# Patient Record
Sex: Female | Born: 1990 | Race: Black or African American | Hispanic: No | Marital: Single | Smoking: Current every day smoker
Health system: Southern US, Community
[De-identification: ages and names within clinical notes are randomized; demographics above are authoritative.]

## PROBLEM LIST (undated history)

## (undated) DIAGNOSIS — L309 Dermatitis, unspecified: Secondary | ICD-10-CM

## (undated) DIAGNOSIS — J302 Other seasonal allergic rhinitis: Secondary | ICD-10-CM

## (undated) DIAGNOSIS — E079 Disorder of thyroid, unspecified: Secondary | ICD-10-CM

---
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
## 2008-06-28 ENCOUNTER — Emergency Department (HOSPITAL_COMMUNITY): Admission: EM | Admit: 2008-06-28 | Discharge: 2008-06-28 | Payer: Self-pay | Admitting: Emergency Medicine

## 2009-01-12 ENCOUNTER — Encounter: Admission: RE | Admit: 2009-01-12 | Discharge: 2009-01-12 | Payer: Self-pay | Admitting: Internal Medicine

## 2009-01-19 ENCOUNTER — Emergency Department (HOSPITAL_COMMUNITY): Admission: EM | Admit: 2009-01-19 | Discharge: 2009-01-19 | Payer: Self-pay | Admitting: Emergency Medicine

## 2010-06-08 ENCOUNTER — Emergency Department (HOSPITAL_COMMUNITY): Admission: EM | Admit: 2010-06-08 | Discharge: 2010-06-08 | Payer: Self-pay | Admitting: Emergency Medicine

## 2010-11-14 LAB — URINALYSIS, ROUTINE W REFLEX MICROSCOPIC
Ketones, ur: NEGATIVE mg/dL
Nitrite: NEGATIVE
Protein, ur: NEGATIVE mg/dL
pH: 8 (ref 5.0–8.0)

## 2010-11-14 LAB — WET PREP, GENITAL
WBC, Wet Prep HPF POC: NONE SEEN
Yeast Wet Prep HPF POC: NONE SEEN

## 2011-01-17 NOTE — Op Note (Signed)
NAME:  CARREN, BLAKLEY                       ACCOUNT NO.:  [id number]   MEDICAL RECORD NO.:  [id number]                   PATIENT TYPE:  INP   LOCATION:  9164                                 FACILITY:  [facility]   PHYSICIAN:  Ginger Carne, MD               DATE OF BIRTH:  05-27-1991   DATE OF PROCEDURE:  03/28/2004  DATE OF DISCHARGE:                                 OPERATIVE REPORT   PREOPERATIVE DIAGNOSES:  First stage of labor failure to progress.   POSTOPERATIVE DIAGNOSES:  First stage of labor failure to progress.  Term  viable delivery of female infant.   PROCEDURE:  Primary low transverse cesarean section.   SURGEON:  Ginger Carne, M.D.   ASSISTANT:  Caren Griffins, C.N.M.   ANESTHESIA:  Epidural with __________.   COMPLICATIONS:  None immediate.   ESTIMATED BLOOD LOSS:  700 mL.   SPECIMENS:  None.   FINDINGS:  Term infant female delivered in a vertex presentation with Apgar's  of 8 & 9 weighing 7 pounds 8 ounces. No gross abnormalities.  Baby cried  spontaneously at delivery.  Uterus, tubes and ovaries showed normal residual  changes of pregnancy.   DESCRIPTION OF PROCEDURE:  The patient prepped and draped in the usual  fashion, placed in the left lateral supine position.  Betadine solution used  for antiseptic and the patient was catheterized prior to the office  procedure.  After epidural analgesia, a Pfannenstiel incision was made and  the abdomen opened.  The lower uterine segment incised transversely after  developing the bladder flap. Placenta removed manually, uterus inspected,  closure of the uterine musculature in one layer with #0 Vicryl running  interlocking suture.  Bleeding points hemostatically checked, blood clots  removed from the abdomen, closure of the parietoperitoneum with 2-0 Vicryl  running suture, #0 Vicryl running for the fascia, 3-0 Vicryl for the  subcutaneous layer and skin staples for the skin. Instrument and sponge  counts were correct.  The patient tolerated the procedure well and returned  to post anesthesia recovery in excellent condition.                                               Ginger Carne, MD    SHB/MEDQ  D:  03/28/2004  T:  03/28/2004  Job:  [id number]

## 2011-01-17 NOTE — Discharge Summary (Signed)
NAME:  Debbie Fisher, Debbie Fisher                       ACCOUNT NO.:  [id number]   MEDICAL RECORD NO.:  [id number]                   PATIENT TYPE:  INP   LOCATION:  9120                                 FACILITY:  [facility]   PHYSICIAN:  Nani Gasser, M.D.            DATE OF BIRTH:  1991/05/11   DATE OF ADMISSION:  03/27/2004  DATE OF DISCHARGE:  03/31/2004                                 DISCHARGE SUMMARY   DISCHARGE DIAGNOSES:  1. Singleton delivery of a female infant via low transverse primary cesarean     section.  2. Postpartum anemia.   HOSPITAL COURSE:  [name] is a 20 year old female G1, P0, who  presented at 53 and 4/7th weeks dated by a ultrasound at 78 and 3/7th.  She  was scheduled for admission for induction of labor secondary to post date;  it was noted that the patient started to have severe irritable decels into  the 80s and then would return to baseline.  The patient was admitted and  induction of labor was deferred until the following day.  Cytotec was placed  and low dose Pitocin was started, an epidural was also placed for pain  control, this was on March 28, 2004.  It appeared that the patients first  stage of labor failed to progress and thus the patient was sent for a  primary low transverse C-section with epidural for anesthesia.  She did  quite well and delivered a female infant that had spontaneous respirations and  did get blow-by O2 for approximately one minute.  There was a three vessel  cord, which was intact, and the placenta was removed manually.  Apgar's of  the female infant were eight at 1 minute and nine at 5 minutes.  The patient  did well postop and did receive medications for pain control.  On discharge  at exam the patient had a heart that was regular rate and rhythm with a 2/6  systolic ejection murmur.  Her lungs were clear to auscultation with no  crackles.  Her abdomen was soft and slightly tender, but fundus was below  the umbilicus.  Her incision  was clean, dry and intact and staples were well  placed.  Skin was well approximated and appeared to be healing well.   MEDICATIONS:  1. Pain control -- Percocet 5/325 mg q.4 hours p.r.n. for pain and ibuprofen     600 mg 1 q.6 hours p.r.n. for pain.  2. Birth control -- Micronor 1 p.o. daily, taken at the same time daily with     three refills.  3. Iron pill 1 p.o. daily for postpartum anemia.  4. Prenatal vitamin 1 p.o. daily since the patient is still breastfeeding.   WOUND CARE/DIET/ACTIVITY:  Regular and per instruction booklet.   FOLLOWUP CARE:  [facility] in six weeks.  She is also to return to [facility]  here in two days for staple removal.  Otherwise the patient  was discharged  home quite well.                                               Nani Gasser, M.D.    CM/MEDQ  D:  03/31/2004  T:  03/31/2004  Job:  [id number]

## 2011-02-02 ENCOUNTER — Inpatient Hospital Stay (INDEPENDENT_AMBULATORY_CARE_PROVIDER_SITE_OTHER)
Admission: RE | Admit: 2011-02-02 | Discharge: 2011-02-02 | Disposition: A | Discharge status: Home / Self Care | Payer: Self-pay | Source: Ambulatory Visit | Attending: Emergency Medicine | Admitting: Emergency Medicine

## 2011-02-02 DIAGNOSIS — L293 Anogenital pruritus, unspecified: Secondary | ICD-10-CM

## 2011-02-02 LAB — POCT URINALYSIS DIP (DEVICE)
Bilirubin Urine: NEGATIVE
Nitrite: NEGATIVE
Protein, ur: NEGATIVE mg/dL
Urobilinogen, UA: 0.2 mg/dL (ref 0.0–1.0)
pH: 5.5 (ref 5.0–8.0)

## 2011-02-03 LAB — GC/CHLAMYDIA PROBE AMP, GENITAL: Chlamydia, DNA Probe: NEGATIVE

## 2011-05-04 ENCOUNTER — Emergency Department (HOSPITAL_COMMUNITY)
Admission: EM | Admit: 2011-05-04 | Discharge: 2011-05-04 | Disposition: A | Discharge status: Home / Self Care | Payer: Self-pay | Attending: Emergency Medicine | Admitting: Emergency Medicine

## 2011-05-04 DIAGNOSIS — H11419 Vascular abnormalities of conjunctiva, unspecified eye: Secondary | ICD-10-CM | POA: Insufficient documentation

## 2011-05-04 DIAGNOSIS — H109 Unspecified conjunctivitis: Secondary | ICD-10-CM | POA: Insufficient documentation

## 2011-05-04 DIAGNOSIS — H5710 Ocular pain, unspecified eye: Secondary | ICD-10-CM | POA: Insufficient documentation

## 2011-05-08 ENCOUNTER — Inpatient Hospital Stay (INDEPENDENT_AMBULATORY_CARE_PROVIDER_SITE_OTHER)
Admission: RE | Admit: 2011-05-08 | Discharge: 2011-05-08 | Disposition: A | Discharge status: Home / Self Care | Payer: Self-pay | Source: Ambulatory Visit | Attending: Family Medicine | Admitting: Family Medicine

## 2011-05-08 DIAGNOSIS — H109 Unspecified conjunctivitis: Secondary | ICD-10-CM

## 2011-05-22 LAB — POCT URINALYSIS DIP (DEVICE)
Glucose, UA: NEGATIVE
Nitrite: NEGATIVE
Operator id: 239701
Urobilinogen, UA: 0.2

## 2011-05-22 LAB — POCT PREGNANCY, URINE
Operator id: 239701
Preg Test, Ur: NEGATIVE

## 2011-06-02 LAB — WET PREP, GENITAL: Yeast Wet Prep HPF POC: NONE SEEN

## 2011-06-02 LAB — URINALYSIS, ROUTINE W REFLEX MICROSCOPIC
Bilirubin Urine: NEGATIVE
Glucose, UA: NEGATIVE
Hgb urine dipstick: NEGATIVE
Protein, ur: NEGATIVE
Urobilinogen, UA: 4 — ABNORMAL HIGH

## 2011-06-02 LAB — URINE MICROSCOPIC-ADD ON

## 2011-06-02 LAB — GC/CHLAMYDIA PROBE AMP, GENITAL: GC Probe Amp, Genital: POSITIVE — AB

## 2011-06-02 LAB — POCT PREGNANCY, URINE: Preg Test, Ur: NEGATIVE

## 2011-08-01 ENCOUNTER — Emergency Department (INDEPENDENT_AMBULATORY_CARE_PROVIDER_SITE_OTHER)
Admission: EM | Admit: 2011-08-01 | Discharge: 2011-08-01 | Disposition: A | Discharge status: Home / Self Care | Payer: Self-pay | Source: Home / Self Care | Attending: Family Medicine | Admitting: Family Medicine

## 2011-08-01 ENCOUNTER — Emergency Department (INDEPENDENT_AMBULATORY_CARE_PROVIDER_SITE_OTHER): Payer: Self-pay

## 2011-08-01 DIAGNOSIS — N760 Acute vaginitis: Secondary | ICD-10-CM

## 2011-08-01 LAB — WET PREP, GENITAL

## 2011-08-01 LAB — POCT URINALYSIS DIP (DEVICE)
Glucose, UA: NEGATIVE mg/dL
Nitrite: NEGATIVE
Protein, ur: NEGATIVE mg/dL
Urobilinogen, UA: 0.2 mg/dL (ref 0.0–1.0)

## 2011-08-01 MED ORDER — METRONIDAZOLE 500 MG PO TABS: 500.0000 mg | ORAL_TABLET | Freq: Two times a day (BID) | ORAL | Status: AC

## 2011-08-01 MED ORDER — IBUPROFEN 800 MG PO TABS: 800.0000 mg | ORAL_TABLET | Freq: Three times a day (TID) | ORAL | Status: AC

## 2011-08-01 NOTE — ED Notes (Signed)
C/o lower abdominal area pain since las PM, a lot like it does when her period is about to start; having changes in her vaginal secretions; has IUD for [facility], placed 2 yr ago

## 2011-08-01 NOTE — ED Provider Notes (Signed)
History     CSN: [id number] Arrival date & time: 08/01/2011  4:26 PM   First MD Initiated Contact with Patient 08/01/11 1624      Chief Complaint  Patient presents with  . Abdominal Pain    (Consider location/radiation/quality/duration/timing/severity/associated sxs/prior treatment) Patient is a 20 y.o. female presenting with abdominal pain. The history is provided by the patient.  Abdominal Pain The primary symptoms of the illness include abdominal pain and vaginal discharge. The primary symptoms of the illness do not include vomiting, diarrhea, dysuria or vaginal bleeding. The current episode started yesterday. The onset of the illness was sudden.  The abdominal pain began yesterday. The pain came on suddenly. The abdominal pain is located in the suprapubic region. The abdominal pain does not radiate. Exacerbated by: walking.  The vaginal discharge was first noticed yesterday. Vaginal discharge is a new problem. The patient believes that the vaginal discharge is unchanged since it began. The amount of discharge is variable. The discharge is described as low viscosity. The vaginal discharge is not associated with dysuria.   The patient states that she believes she is currently not pregnant. The patient has not had a change in bowel habit.    History reviewed. No pertinent past medical history.  History reviewed. No pertinent past surgical history.  No family history on file.  History  Substance Use Topics  . Smoking status: Current Everyday Smoker -- 1.0 packs/day  . Smokeless tobacco: Not on file  . Alcohol Use: Yes    OB History    Grav Para Term Preterm Abortions TAB SAB Ect Mult Living                  Review of Systems  Constitutional: Negative.   HENT: Negative.   Respiratory: Negative.   Cardiovascular: Negative.   Gastrointestinal: Positive for abdominal pain. Negative for vomiting and diarrhea.  Genitourinary: Positive for vaginal discharge. Negative for  dysuria and vaginal bleeding.  Skin: Negative.     Allergies  Review of patient's allergies indicates no known allergies.  Home Medications   Current Outpatient Rx  Name Route Sig Dispense Refill  . IBUPROFEN 800 MG PO TABS Oral Take 1 tablet (800 mg total) by mouth 3 (three) times daily. 21 tablet 0  . METRONIDAZOLE 500 MG PO TABS Oral Take 1 tablet (500 mg total) by mouth 2 (two) times daily. 14 tablet 0    BP 156/88  Pulse 97  Temp(Src) 98.4 F (36.9 C) (Oral)  Resp 19  SpO2 98%  Physical Exam  Nursing note and vitals reviewed. Constitutional: She appears well-developed and well-nourished. She appears distressed.  HENT:  Head: Atraumatic.  Cardiovascular: Normal rate and regular rhythm.   Pulmonary/Chest: Effort normal.  Abdominal: Soft. Bowel sounds are normal. She exhibits no distension. There is no tenderness. There is no rebound and no guarding.  Genitourinary:       Pelvic exam with female nursing personal assisting reveals no skin or vulvar lesion. Frothy discharge noted. No cmt.     ED Course  Procedures (including critical care time)  Labs Reviewed  POCT URINALYSIS DIP (DEVICE) - Abnormal; Notable for the following:    Hgb urine dipstick SMALL (*)    Leukocytes, UA SMALL (*) Biochemical Testing Only. Please order routine urinalysis from main lab if confirmatory testing is needed.   All other components within normal limits  POCT PREGNANCY, URINE  POCT PREGNANCY, URINE  POCT URINALYSIS DIPSTICK  GC/CHLAMYDIA PROBE AMP, GENITAL  WET PREP,  GENITAL   Dg Abd 1 View  08/01/2011  *RADIOLOGY REPORT*  Clinical Data:  Lower abdominal pain, cramping.  None.  ABDOMEN - 1 VIEW  Comparison: None.  Findings: There is a nonobstructive bowel gas pattern.  No supine evidence of free air.  No organomegaly or suspicious calcification.  No acute bony abnormality.  IUD in place.  IMPRESSION: No acute findings.  Original Report Authenticated By: Cyndie Chime, M.D.     1.  Vaginitis       MDM          Randa Spike, MD 08/01/11 [phone]

## 2011-08-05 ENCOUNTER — Telehealth (HOSPITAL_COMMUNITY): Payer: Self-pay | Admitting: Emergency Medicine

## 2011-08-05 LAB — GC/CHLAMYDIA PROBE AMP, GENITAL: GC Probe Amp, Genital: POSITIVE — AB

## 2011-08-05 MED ORDER — CEFTRIAXONE SODIUM 250 MG IJ SOLR: 250.0000 mg | Freq: Once | INTRAMUSCULAR | Status: AC

## 2011-08-05 NOTE — ED Notes (Signed)
Order received from Dr. Ladon Applebaum for Rocephin injection. Debbie Fisher M12/12/2010

## 2011-08-05 NOTE — ED Notes (Signed)
Labs and medications reviewed.  Pt. adequately treated with Flagyl for bacterial vaginosis.  Pt. needs tx. for GC. Message sent to Dr. Ladon Applebaum. Vassie Moselle 08/05/2011

## 2011-08-06 ENCOUNTER — Emergency Department (INDEPENDENT_AMBULATORY_CARE_PROVIDER_SITE_OTHER)
Admission: EM | Admit: 2011-08-06 | Discharge: 2011-08-06 | Disposition: A | Discharge status: Home / Self Care | Payer: Self-pay | Source: Home / Self Care | Attending: Emergency Medicine | Admitting: Emergency Medicine

## 2011-08-06 ENCOUNTER — Encounter (HOSPITAL_COMMUNITY): Payer: Self-pay | Admitting: *Deleted

## 2011-08-06 DIAGNOSIS — A549 Gonococcal infection, unspecified: Secondary | ICD-10-CM

## 2011-08-06 DIAGNOSIS — A5400 Gonococcal infection of lower genitourinary tract, unspecified: Secondary | ICD-10-CM

## 2011-08-06 HISTORY — DX: Dermatitis, unspecified: L30.9

## 2011-08-06 HISTORY — DX: Other seasonal allergic rhinitis: J30.2

## 2011-08-06 MED ORDER — CEFTRIAXONE SODIUM 250 MG IJ SOLR
INTRAMUSCULAR | Status: AC
  Filled 2011-08-06: qty 250

## 2011-08-06 MED ORDER — CEFTRIAXONE SODIUM 250 MG IJ SOLR
250.0000 mg | Freq: Once | INTRAMUSCULAR | Status: AC
  Administered 2011-08-06: 250 mg via INTRAMUSCULAR

## 2011-08-06 MED ORDER — LIDOCAINE HCL (PF) 1 % IJ SOLN
INTRAMUSCULAR | Status: AC
  Filled 2011-08-06: qty 5

## 2011-08-06 MED ORDER — RABIES VACCINE, PCEC IM SUSR: 1.0000 mL | Freq: Once | INTRAMUSCULAR | Status: DC

## 2011-08-06 NOTE — ED Notes (Signed)
DHHS form completed and faxed to the [facility]. Pt.instructed to notify her partner, no sex for 1 week and to practice safe sex. Pt.voiced understanding. Vassie Moselle 08/06/2011

## 2011-08-06 NOTE — ED Notes (Signed)
Pt. here for Rocephin injection for GC.

## 2011-08-08 NOTE — ED Provider Notes (Signed)
History     CSN: [id number] Arrival date & time: 08/06/2011  5:45 PM   First MD Initiated Contact with Patient 08/06/11 1718      Chief Complaint  Patient presents with  . SEXUALLY TRANSMITTED DISEASE    (Consider location/radiation/quality/duration/timing/severity/associated sxs/prior treatment) HPI  Past Medical History  Diagnosis Date  . Eczema   . Seasonal allergies     Past Surgical History  Procedure Date  . Cesarean section     Family History  Problem Relation Age of Onset  . Schizophrenia Mother   . Bipolar disorder Mother   . Hypertension Mother   . Rheum arthritis Other   . Diabetes Other     History  Substance Use Topics  . Smoking status: Current Everyday Smoker -- 0.7 packs/day    Types: Cigarettes  . Smokeless tobacco: Not on file  . Alcohol Use: 1.2 oz/week    2 Cans of beer per week     24 oz beer or 2 shots or liquor    OB History    Grav Para Term Preterm Abortions TAB SAB Ect Mult Living                  Review of Systems  Allergies  Review of patient's allergies indicates no known allergies.  Home Medications   Current Outpatient Rx  Name Route Sig Dispense Refill  . CEFTRIAXONE SODIUM 250 MG IJ SOLR Intramuscular Inject 250 mg into the muscle once.  FOR IM use in LARGE MUSCLE MASS 1 each 0  . IBUPROFEN 800 MG PO TABS Oral Take 1 tablet (800 mg total) by mouth 3 (three) times daily. 21 tablet 0  . METRONIDAZOLE 500 MG PO TABS Oral Take 1 tablet (500 mg total) by mouth 2 (two) times daily. 14 tablet 0    BP 126/63  Pulse 76  Temp(Src) 98.5 F (36.9 C) (Oral)  Resp 16  SpO2 99%  LMP 08/06/2011  Physical Exam  ED Course  Procedures (including critical care time)  Labs Reviewed - No data to display No results found.   No diagnosis found.    MDM  STS RETURN (CEFTRIAXONE IM) + GN        Jimmie Molly, MD 08/08/11 [phone]

## 2011-09-11 ENCOUNTER — Encounter (HOSPITAL_COMMUNITY): Payer: Self-pay | Admitting: *Deleted

## 2011-09-11 ENCOUNTER — Emergency Department (HOSPITAL_COMMUNITY)
Admission: EM | Admit: 2011-09-11 | Discharge: 2011-09-12 | Disposition: A | Discharge status: Home / Self Care | Payer: Self-pay | Attending: Emergency Medicine | Admitting: Emergency Medicine

## 2011-09-11 DIAGNOSIS — L02411 Cutaneous abscess of right axilla: Secondary | ICD-10-CM

## 2011-09-11 DIAGNOSIS — IMO0002 Reserved for concepts with insufficient information to code with codable children: Secondary | ICD-10-CM | POA: Insufficient documentation

## 2011-09-11 DIAGNOSIS — F17200 Nicotine dependence, unspecified, uncomplicated: Secondary | ICD-10-CM | POA: Insufficient documentation

## 2011-09-11 NOTE — ED Notes (Signed)
Pt has area under in right arm pit that gets big then drains.  Pt st's area drained yesterday now there appears to be a open hole that is painful.

## 2011-09-11 NOTE — ED Notes (Signed)
She has had boils under her rt arm for approx one month.  It gets painful than drains then returns in the same spot.

## 2011-09-12 MED ORDER — CEPHALEXIN 500 MG PO CAPS: 500.0000 mg | ORAL_CAPSULE | Freq: Four times a day (QID) | ORAL | Status: AC

## 2011-09-12 MED ORDER — HYDROCODONE-ACETAMINOPHEN 5-325 MG PO TABS: 1.0000 | ORAL_TABLET | ORAL | Status: AC | PRN

## 2011-09-12 NOTE — ED Provider Notes (Signed)
History     CSN: [id number]  Arrival date & time 09/11/11  2011   First MD Initiated Contact with Patient 09/11/11 2212      Chief Complaint  Patient presents with  . Recurrent Skin Infections    (Consider location/radiation/quality/duration/timing/severity/associated sxs/prior treatment) HPI History provided by pt.   Pt c/o pain and purulent drainage from right axilla since yesterday evening.  No known trauma.  No associated fever.  No h/o abscess. Otherwise feeling well.    Past Medical History  Diagnosis Date  . Eczema   . Seasonal allergies     Past Surgical History  Procedure Date  . Cesarean section     Family History  Problem Relation Age of Onset  . Schizophrenia Mother   . Bipolar disorder Mother   . Hypertension Mother   . Rheum arthritis Other   . Diabetes Other     History  Substance Use Topics  . Smoking status: Current Everyday Smoker -- 0.7 packs/day    Types: Cigarettes  . Smokeless tobacco: Not on file  . Alcohol Use: 1.2 oz/week    2 Cans of beer per week     24 oz beer or 2 shots or liquor    OB History    Grav Para Term Preterm Abortions TAB SAB Ect Mult Living                  Review of Systems  All other systems reviewed and are negative.    Allergies  Review of patient's allergies indicates no known allergies.  Home Medications   Current Outpatient Rx  Name Route Sig Dispense Refill  . CEPHALEXIN 500 MG PO CAPS Oral Take 1 capsule (500 mg total) by mouth 4 (four) times daily. 14 capsule 0  . HYDROCODONE-ACETAMINOPHEN 5-325 MG PO TABS Oral Take 1 tablet by mouth every 4 (four) hours as needed for pain. 10 tablet 0    BP 151/86  Pulse 87  Temp(Src) 98.4 F (36.9 C) (Oral)  Resp 18  SpO2 99%  LMP 09/03/2011  Physical Exam  Nursing note and vitals reviewed. Constitutional: She is oriented to person, place, and time. She appears well-developed and well-nourished. No distress.  HENT:  Head: Normocephalic and  atraumatic.  Eyes:       Normal appearance  Neck: Normal range of motion.  Neurological: She is alert and oriented to person, place, and time.  Skin:       Right axilla w/ 1cm circular hole w/ tiny amt purulent drainage.  Squeezed out multiple small, green chunks of solid material.   No surrounding erythema.  Ttp.    Psychiatric: She has a normal mood and affect. Her behavior is normal.    ED Course  Procedures (including critical care time) INCISION AND DRAINAGE Performed by: Otilio Miu Consent: Verbal consent obtained. Risks and benefits: risks, benefits and alternatives were discussed Type: abscess  Body area: right axilla  Anesthesia: local infiltration  Local anesthetic: lidocaine 2% w/ epinephrine  Anesthetic total: 5 ml  Complexity: complex Blunt dissection to break up loculations  Drainage: purulent and small, green, solid chunks  Drainage amount: minimal  Packing material: 1/4 in iodoform gauze  Patient tolerance: Patient tolerated the procedure well with no immediate complications.    Labs Reviewed - No data to display No results found.   1. Abscess of axilla, right       MDM  Pt presents w/ right axillary lesion w/ drainage of sm amt pus and  multiple pieces of green, solid material.  I/D'd and packed.  Likely had clogged sebaceous glands.  Pt d/c'd home w/ abx, short course of pain medication and referral to [facility] for wound recheck.          Otilio Miu, [place] 09/12/11 [phone]

## 2011-09-12 NOTE — ED Provider Notes (Signed)
Medical screening examination/treatment/procedure(s) were performed by non-physician practitioner and as supervising physician I was immediately available for consultation/collaboration.   Benny Lennert, MD 09/12/11 [phone]

## 2011-09-14 ENCOUNTER — Emergency Department (INDEPENDENT_AMBULATORY_CARE_PROVIDER_SITE_OTHER)
Admission: EM | Admit: 2011-09-14 | Discharge: 2011-09-14 | Disposition: A | Discharge status: Home / Self Care | Payer: Self-pay | Source: Home / Self Care | Attending: Emergency Medicine | Admitting: Emergency Medicine

## 2011-09-14 ENCOUNTER — Encounter (HOSPITAL_COMMUNITY): Payer: Self-pay

## 2011-09-14 DIAGNOSIS — L02411 Cutaneous abscess of right axilla: Secondary | ICD-10-CM

## 2011-09-14 DIAGNOSIS — IMO0002 Reserved for concepts with insufficient information to code with codable children: Secondary | ICD-10-CM

## 2011-09-14 NOTE — ED Provider Notes (Signed)
History     CSN: [id number]  Arrival date & time 09/14/11  1711   First MD Initiated Contact with Patient 09/14/11 1743      Chief Complaint  Patient presents with  . Wound Check    remove packing which was inserted on thurs. under rt axilla    (Consider location/radiation/quality/duration/timing/severity/associated sxs/prior treatment) HPI Comments: The patient had a boil in her right axilla. She was seen at the emergency room and pus was expressed with a little bit of pressure and the wound was packed. She returns today to have the packing removed and a recheck. She was placed on cephalexin and hydrocodone she's been taking both. She's had a little bit of drainage from the area but denies much pain. She's had no fever or chills.  Patient is a 21 y.o. female presenting with wound check.  Wound Check     Past Medical History  Diagnosis Date  . Eczema   . Seasonal allergies     Past Surgical History  Procedure Date  . Cesarean section     Family History  Problem Relation Age of Onset  . Schizophrenia Mother   . Bipolar disorder Mother   . Hypertension Mother   . Rheum arthritis Other   . Diabetes Other     History  Substance Use Topics  . Smoking status: Current Everyday Smoker -- 0.7 packs/day    Types: Cigarettes  . Smokeless tobacco: Not on file  . Alcohol Use: 1.2 oz/week    2 Cans of beer per week     24 oz beer or 2 shots or liquor    OB History    Grav Para Term Preterm Abortions TAB SAB Ect Mult Living                  Review of Systems  Constitutional: Negative for fever and chills.  Skin: Negative for color change, pallor, rash and wound.    Allergies  Review of patient's allergies indicates no known allergies.  Home Medications   Current Outpatient Rx  Name Route Sig Dispense Refill  . CEPHALEXIN 500 MG PO CAPS Oral Take 1 capsule (500 mg total) by mouth 4 (four) times daily. 14 capsule 0  . HYDROCODONE-ACETAMINOPHEN 5-325 MG PO TABS  Oral Take 1 tablet by mouth every 4 (four) hours as needed for pain. 10 tablet 0    BP 131/83  Pulse 72  Temp(Src) 98.5 F (36.9 C) (Oral)  Resp 15  SpO2 99%  LMP 09/03/2011  Physical Exam  Nursing note and vitals reviewed. Constitutional: She appears well-developed and well-nourished. No distress.  Skin: Skin is warm and dry. No abrasion, no bruising, no ecchymosis, no lesion and no rash noted. She is not diaphoretic. No erythema. No pallor.       Exam of the axilla reveals an abscess that has been packed. The packing fell out why remove the dressing. The wound cavity appears clear and there was no surrounding induration, tenderness, or erythema.    ED Course  Procedures (including critical care time)  Labs Reviewed - No data to display No results found.   1. Abscess of right axilla       MDM  Or abscess is healing up well. She was encouraged to finish up her antibiotic and given instructions in wound care.        Roque Lias, MD 09/14/11 [phone]

## 2011-09-14 NOTE — ED Notes (Signed)
Pt tx in ed on thurs for abcess under rt axilla , packing inserted . To be removed today

## 2013-01-10 ENCOUNTER — Encounter (HOSPITAL_COMMUNITY): Payer: Self-pay | Admitting: Emergency Medicine

## 2013-01-10 ENCOUNTER — Emergency Department (HOSPITAL_COMMUNITY)
Admission: EM | Admit: 2013-01-10 | Discharge: 2013-01-10 | Disposition: A | Discharge status: Home / Self Care | Payer: Self-pay | Attending: Emergency Medicine | Admitting: Emergency Medicine

## 2013-01-10 DIAGNOSIS — R51 Headache: Secondary | ICD-10-CM | POA: Insufficient documentation

## 2013-01-10 DIAGNOSIS — R05 Cough: Secondary | ICD-10-CM | POA: Insufficient documentation

## 2013-01-10 DIAGNOSIS — H538 Other visual disturbances: Secondary | ICD-10-CM | POA: Insufficient documentation

## 2013-01-10 DIAGNOSIS — R630 Anorexia: Secondary | ICD-10-CM | POA: Insufficient documentation

## 2013-01-10 DIAGNOSIS — J3489 Other specified disorders of nose and nasal sinuses: Secondary | ICD-10-CM | POA: Insufficient documentation

## 2013-01-10 DIAGNOSIS — B9789 Other viral agents as the cause of diseases classified elsewhere: Secondary | ICD-10-CM | POA: Insufficient documentation

## 2013-01-10 DIAGNOSIS — R42 Dizziness and giddiness: Secondary | ICD-10-CM | POA: Insufficient documentation

## 2013-01-10 DIAGNOSIS — R197 Diarrhea, unspecified: Secondary | ICD-10-CM | POA: Insufficient documentation

## 2013-01-10 DIAGNOSIS — R6883 Chills (without fever): Secondary | ICD-10-CM | POA: Insufficient documentation

## 2013-01-10 DIAGNOSIS — B349 Viral infection, unspecified: Secondary | ICD-10-CM

## 2013-01-10 DIAGNOSIS — Z3202 Encounter for pregnancy test, result negative: Secondary | ICD-10-CM | POA: Insufficient documentation

## 2013-01-10 DIAGNOSIS — J029 Acute pharyngitis, unspecified: Secondary | ICD-10-CM | POA: Insufficient documentation

## 2013-01-10 DIAGNOSIS — Z872 Personal history of diseases of the skin and subcutaneous tissue: Secondary | ICD-10-CM | POA: Insufficient documentation

## 2013-01-10 DIAGNOSIS — R059 Cough, unspecified: Secondary | ICD-10-CM | POA: Insufficient documentation

## 2013-01-10 DIAGNOSIS — R112 Nausea with vomiting, unspecified: Secondary | ICD-10-CM | POA: Insufficient documentation

## 2013-01-10 DIAGNOSIS — F17200 Nicotine dependence, unspecified, uncomplicated: Secondary | ICD-10-CM | POA: Insufficient documentation

## 2013-01-10 DIAGNOSIS — Z9889 Other specified postprocedural states: Secondary | ICD-10-CM | POA: Insufficient documentation

## 2013-01-10 LAB — CBC WITH DIFFERENTIAL/PLATELET
Basophils Absolute: 0 10*3/uL (ref 0.0–0.1)
Basophils Relative: 0 % (ref 0–1)
Eosinophils Absolute: 0.3 10*3/uL (ref 0.0–0.7)
Eosinophils Relative: 3 % (ref 0–5)
HCT: 40.5 % (ref 36.0–46.0)
Hemoglobin: 13.6 g/dL (ref 12.0–15.0)
Lymphocytes Relative: 25 % (ref 12–46)
Lymphs Abs: 2.2 10*3/uL (ref 0.7–4.0)
MCH: 30.4 pg (ref 26.0–34.0)
MCHC: 33.6 g/dL (ref 30.0–36.0)
MCV: 90.4 fL (ref 78.0–100.0)
Monocytes Absolute: 0.7 K/uL (ref 0.1–1.0)
Monocytes Relative: 7 % (ref 3–12)
Neutro Abs: 5.7 K/uL (ref 1.7–7.7)
Neutrophils Relative %: 65 % (ref 43–77)
Platelets: 271 10*3/uL (ref 150–400)
RBC: 4.48 MIL/uL (ref 3.87–5.11)
RDW: 13.1 % (ref 11.5–15.5)
WBC: 8.9 10*3/uL (ref 4.0–10.5)

## 2013-01-10 LAB — COMPREHENSIVE METABOLIC PANEL
AST: 17 U/L (ref 0–37)
CO2: 26 mEq/L (ref 19–32)
Calcium: 9.1 mg/dL (ref 8.4–10.5)
GFR calc non Af Amer: >90 mL/min (ref >90)
Glucose, Bld: 85 mg/dL (ref 70–99)
Potassium: 4.2 mEq/L (ref 3.5–5.1)
Sodium: 136 mEq/L (ref 135–145)
Total Bilirubin: 0.3 mg/dL (ref 0.3–1.2)
Total Protein: 7.5 g/dL (ref 6.0–8.3)

## 2013-01-10 LAB — URINALYSIS, ROUTINE W REFLEX MICROSCOPIC
Bilirubin Urine: NEGATIVE
Glucose, UA: NEGATIVE mg/dL
Ketones, ur: NEGATIVE mg/dL
Leukocytes, UA: NEGATIVE
Nitrite: NEGATIVE
Protein, ur: NEGATIVE mg/dL
Specific Gravity, Urine: 1.016 (ref 1.005–1.030)
Urobilinogen, UA: 1 mg/dL (ref 0.0–1.0)
pH: 6 (ref 5.0–8.0)

## 2013-01-10 LAB — RAPID STREP SCREEN (MED CTR MEBANE ONLY): Streptococcus, Group A Screen (Direct): NEGATIVE

## 2013-01-10 LAB — COMPREHENSIVE METABOLIC PANEL WITH GFR
ALT: 19 U/L (ref 0–35)
Albumin: 3.9 g/dL (ref 3.5–5.2)
Alkaline Phosphatase: 71 U/L (ref 39–117)
BUN: 9 mg/dL (ref 6–23)
Chloride: 102 meq/L (ref 96–112)
Creatinine, Ser: 0.75 mg/dL (ref 0.50–1.10)
GFR calc Af Amer: >90 mL/min (ref >90)

## 2013-01-10 LAB — POCT PREGNANCY, URINE: Preg Test, Ur: NEGATIVE

## 2013-01-10 LAB — LIPASE, BLOOD: Lipase: 25 U/L (ref 11–59)

## 2013-01-10 LAB — URINE MICROSCOPIC-ADD ON

## 2013-01-10 MED ORDER — IBUPROFEN 200 MG PO TABS
400.0000 mg | ORAL_TABLET | Freq: Once | ORAL | Status: AC
  Administered 2013-01-10: 400 mg via ORAL
  Filled 2013-01-10: qty 2

## 2013-01-10 MED ORDER — ACETAMINOPHEN-CODEINE #3 300-30 MG PO TABS: 1.0000 | ORAL_TABLET | Freq: Four times a day (QID) | ORAL | Status: DC | PRN

## 2013-01-10 MED ORDER — ONDANSETRON 8 MG PO TBDP
8.0000 mg | ORAL_TABLET | Freq: Once | ORAL | Status: AC
  Administered 2013-01-10: 8 mg via ORAL
  Filled 2013-01-10: qty 1

## 2013-01-10 MED ORDER — PROMETHAZINE HCL 25 MG PO TABS: 25.0000 mg | ORAL_TABLET | Freq: Four times a day (QID) | ORAL | Status: DC | PRN

## 2013-01-10 NOTE — Discharge Instructions (Signed)
 Viral Syndrome You or your child has Viral Syndrome. It is the most common infection causing colds and infections in the nose, throat, sinuses, and breathing tubes. Sometimes the infection causes nausea, vomiting, or diarrhea. The germ that causes the infection is a virus. No antibiotic or other medicine will kill it. There are medicines that you can take to make you or your child more comfortable.  HOME CARE INSTRUCTIONS   Rest in bed until you start to feel better.  If you have diarrhea or vomiting, eat small amounts of crackers and toast. Soup is helpful.  Do not give aspirin or medicine that contains aspirin to children.  Only take over-the-counter or prescription medicines for pain, discomfort, or fever as directed by your caregiver. SEEK IMMEDIATE MEDICAL CARE IF:   You or your child has not improved within one week.  You or your child has pain that is not at least partially relieved by over-the-counter medicine.  Thick, colored mucus or blood is coughed up.  Discharge from the nose becomes thick yellow or green.  Diarrhea or vomiting gets worse.  There is any major change in your or your child's condition.  You or your child develops a skin rash, stiff neck, severe headache, or are unable to hold down food or fluid.  You or your child has an oral temperature above 102 F (38.9 C), not controlled by medicine.  Document Released: 08/03/2006 Document Revised: 11/10/2011 Document Reviewed: 08/04/2007 [facility] Patient Information 2013 [place], [place].   Narcotic and benzodiazepine use may cause drowsiness, slowed breathing or dependence.  Please use with caution and do not drive, operate machinery or watch young children alone while taking them.  Taking combinations of these medications or drinking alcohol will potentiate these effects.

## 2013-01-10 NOTE — ED Notes (Signed)
Pt c/o runny nose, sore throat, vomiting, diarrhea, dizziness, and blurred vision for 4 days.

## 2013-01-10 NOTE — ED Provider Notes (Signed)
History     CSN: [id number]  Arrival date & time 01/10/13  1601   First MD Initiated Contact with Patient 01/10/13 1805      Chief Complaint  Patient presents with  . Emesis  . Diarrhea    (Consider location/radiation/quality/duration/timing/severity/associated sxs/prior treatment) HPI Comments: Patient reports a few days ago began having nasal congestion, sinus headaches, sore throat, mild dry cough. She reports that her son had recent diagnosis of viral illness with very similar symptoms. She reports she had one episode of nausea and vomiting this morning, tried to go to work today, had some loose stools and was having episodes of weakness, blurred vision and feeling transiently faint. Most of the symptoms have since resolved. She did take some over-the-counter Tylenol for her headaches with only transient, mild improvement. She reports she has not eaten or drank anything today to her nausea and loss of appetite. She denies skin rash, stiff neck, focal numbness or weakness. She denies any confusion. She does have a history of allergies, denies history of asthma. She also denies any significant abdominal pain, flank pain. She denies urinary frequency or dysuria. She does not believe that she is pregnant. She does have an IUD in place.  Patient is a 22 y.o. female presenting with vomiting and diarrhea. The history is provided by the patient.  Emesis Associated symptoms: chills, diarrhea, headaches and sore throat   Associated symptoms: no abdominal pain   Diarrhea Associated symptoms: chills, headaches and vomiting   Associated symptoms: no abdominal pain and no fever     Past Medical History  Diagnosis Date  . Eczema   . Seasonal allergies     Past Surgical History  Procedure Laterality Date  . Cesarean section      Family History  Problem Relation Age of Onset  . Schizophrenia Mother   . Bipolar disorder Mother   . Hypertension Mother   . Rheum arthritis Other   .  Diabetes Other     History  Substance Use Topics  . Smoking status: Current Every Day Smoker -- 0.75 packs/day    Types: Cigarettes  . Smokeless tobacco: Not on file  . Alcohol Use: 1.2 oz/week    2 Cans of beer per week     Comment: 24 oz beer or 2 shots or liquor    OB History   Grav Para Term Preterm Abortions TAB SAB Ect Mult Living                  Review of Systems  Constitutional: Positive for chills and appetite change. Negative for fever.  HENT: Positive for congestion, sore throat and rhinorrhea. Negative for neck pain, neck stiffness and voice change.   Eyes: Positive for visual disturbance. Negative for photophobia.  Respiratory: Positive for cough. Negative for shortness of breath and wheezing.   Cardiovascular: Negative for chest pain.  Gastrointestinal: Positive for nausea, vomiting and diarrhea. Negative for abdominal pain.  Skin: Negative for rash.  Neurological: Positive for dizziness and headaches. Negative for syncope and speech difficulty.  All other systems reviewed and are negative.    Allergies  Review of patient's allergies indicates no known allergies.  Home Medications   Current Outpatient Rx  Name  Route  Sig  Dispense  Refill  . cetirizine (ZYRTEC) 10 MG tablet   Oral   Take 10 mg by mouth daily as needed for allergies.         Marland Kitchen OVER THE COUNTER MEDICATION   Oral  Take 1 tablet by mouth 2 (two) times daily as needed. Over the counter headache medication as needed         . acetaminophen-codeine (TYLENOL #3) 300-30 MG per tablet   Oral   Take 1-2 tablets by mouth every 6 (six) hours as needed for pain.   15 tablet   0   . promethazine (PHENERGAN) 25 MG tablet   Oral   Take 1 tablet (25 mg total) by mouth every 6 (six) hours as needed for nausea.   20 tablet   0     BP 144/74  Pulse 68  Temp(Src) 98.8 F (37.1 C) (Oral)  Resp 16  SpO2 98%  LMP 01/08/2013  Physical Exam  Nursing note and vitals  reviewed. Constitutional: She is oriented to person, place, and time. She appears well-developed and well-nourished.  Non-toxic appearance. She does not have a sickly appearance. She does not appear ill. No distress.  HENT:  Head: Normocephalic and atraumatic.  Mouth/Throat: No oropharyngeal exudate.  Eyes: Conjunctivae and EOM are normal. No scleral icterus.  Neck: No thyromegaly present.  Pulmonary/Chest: Effort normal. No respiratory distress. She has no wheezes. She has no rales.  Minimal paroxysmal dry coughing  Abdominal: Soft. She exhibits no distension. There is no tenderness. There is no rebound and no guarding.  Lymphadenopathy:    She has no cervical adenopathy.  Neurological: She is alert and oriented to person, place, and time. No cranial nerve deficit. Coordination normal.  Skin: Skin is warm and dry. No rash noted. She is not diaphoretic.    ED Course  Procedures (including critical care time)  Labs Reviewed  RAPID STREP SCREEN  CBC WITH DIFFERENTIAL  COMPREHENSIVE METABOLIC PANEL  LIPASE, BLOOD  URINALYSIS, ROUTINE W REFLEX MICROSCOPIC  POCT PREGNANCY, URINE   No results found.   1. Viral illness     Room air saturation is 98% I interpret this to be  MDM   Patient with multiple symptoms, however stable vital signs, does not appear toxic. Most likely the patient does have a viral infection causing both nasal congestion, sore throat, GI symptoms. lungs are clear. Strep screen is negative. Urine pregnancy is negative. Her CBC, CMP and lipase are all within normal limits. Patient is reassured, told this is a self-limiting illness, will provide prescriptions for symptomatic relief        Gavin Pound. Oletta Lamas, MD 01/10/13 2009

## 2013-06-22 ENCOUNTER — Emergency Department (HOSPITAL_COMMUNITY)
Admission: EM | Admit: 2013-06-22 | Discharge: 2013-06-22 | Disposition: A | Discharge status: Home / Self Care | Payer: BC Managed Care – PPO | Source: Home / Self Care

## 2013-06-22 ENCOUNTER — Encounter (HOSPITAL_COMMUNITY): Payer: Self-pay | Admitting: Emergency Medicine

## 2013-06-22 DIAGNOSIS — N3090 Cystitis, unspecified without hematuria: Secondary | ICD-10-CM

## 2013-06-22 LAB — POCT URINALYSIS DIP (DEVICE)
Protein, ur: 30 mg/dL — AB
Specific Gravity, Urine: >=1.03 (ref 1.005–1.030)
Urobilinogen, UA: 0.2 mg/dL (ref 0.0–1.0)
pH: 6.5 (ref 5.0–8.0)

## 2013-06-22 MED ORDER — SULFAMETHOXAZOLE-TMP DS 800-160 MG PO TABS: 1.0000 | ORAL_TABLET | Freq: Two times a day (BID) | ORAL | Status: DC

## 2013-06-22 NOTE — ED Notes (Signed)
Pt  Reports  Symptoms  Of  Burning  On  Urination    Sensation of  Not  Emptying bladder  Completely  As  Well  As  Low  abd  Pain      she  Ambulated  To room with a  Slow  Steady  Gait

## 2013-06-22 NOTE — ED Provider Notes (Signed)
CSN: [id number]     Arrival date & time 06/22/13  1350 History   None    Chief Complaint  Patient presents with  . Dysuria   HPI 22 year old female known history of eczema and seasonal allergies presented to [facility] 06/22/13 with abdominal pain She states that she has had dysuria for the past 2 or 3 days along with stridor he had the end of urination as well as frequency of urination. She has felt some chills. She did not measure her temperature. She states that remotely about one month ago she had unprotected sex and is worried that she might have an STD She also states that she has an IUD that was placed about 4-5 years ago and does not have regular periods She states that her urine smells a little bit strong, she has no blood in it She has no vaginal discharge other than leukorrhea which is clear and does not have any odour   Past Medical History  Diagnosis Date  . Eczema   . Seasonal allergies    Past Surgical History  Procedure Laterality Date  . Cesarean section     Family History  Problem Relation Age of Onset  . Schizophrenia Mother   . Bipolar disorder Mother   . Hypertension Mother   . Rheum arthritis Other   . Diabetes Other    History  Substance Use Topics  . Smoking status: Current Every Day Smoker -- 0.75 packs/day    Types: Cigarettes  . Smokeless tobacco: Not on file  . Alcohol Use: 1.2 oz/week    2 Cans of beer per week     Comment: 24 oz beer or 2 shots or liquor   OB History   Grav Para Term Preterm Abortions TAB SAB Ect Mult Living                 Review of Systems Denies chest pain nausea but vomiting Has had a little bit of chronic diarrhea for the past week one to 2 times a day She has no other specific issue at present  Allergies  Review of patient's allergies indicates no known allergies.  Home Medications   Current Outpatient Rx  Name  Route  Sig  Dispense  Refill  . acetaminophen-codeine (TYLENOL #3) 300-30 MG per tablet   Oral  Take 1-2 tablets by mouth every 6 (six) hours as needed for pain.   15 tablet   0   . cetirizine (ZYRTEC) 10 MG tablet   Oral   Take 10 mg by mouth daily as needed for allergies.         Marland Kitchen OVER THE COUNTER MEDICATION   Oral   Take 1 tablet by mouth 2 (two) times daily as needed. Over the counter headache medication as needed         . promethazine (PHENERGAN) 25 MG tablet   Oral   Take 1 tablet (25 mg total) by mouth every 6 (six) hours as needed for nausea.   20 tablet   0    BP 151/85  Pulse 77  Temp(Src) 98.3 F (36.8 C) (Oral)  Resp 20  SpO2 99% Physical Exam Alert pleasant oriented no apparent distress EOMI, NCAT, good dentition Soft supple neck No thyromegaly Chest clinically clear Abdomen is soft  but does have some suprapubic tenderness in the lower portion    ED Course  Procedures (including critical care time) Labs Review Labs Reviewed - No data to display Imaging Review No results found.  EKG Interpretation     Ventricular Rate:    PR Interval:    QRS Duration:   QT Interval:    QTC Calculation:   R Axis:     Text Interpretation:              MDM  No diagnosis found. Possible UTI-UA shows small leukocytes, small hemoglobin and elevated specific gravity.  Will treat as cystitis uncomplicated with Bactrim DS Elevated hypertension without diagnosis of hypertension-she'll need followup with her primary care physician. We'll recheck her blood pressure before leaving Morbid obesity, There is no height or weight on file to calculate BMI. her BMI is clearly above 30. She'll need intervention with regards to her weight and will need to get an A1c lipid panel and other preventive care as scheduled as an outpatient Contraception-she has an IUD.  This will need to monitor High-risk sexual behavior-I've counseled her regarding use of condoms  Patient says that she feels no better and does not get relief from the Bactrim DS, this could be an STI  and she will need counseling for that as well as testing separately. He reiterates to me that she has had no vaginal itching burning or discharge and clinically this is cystitis        Rhetta Mura, MD 06/22/13 1433

## 2013-10-05 IMAGING — CR DG ABDOMEN 1V
1 series · 1 of 1 positions shown · non-contrast
Comparison: None.

CLINICAL DATA: Lower abdominal pain, cramping.

None.
ABDOMEN - 1 VIEW

[view not recorded]
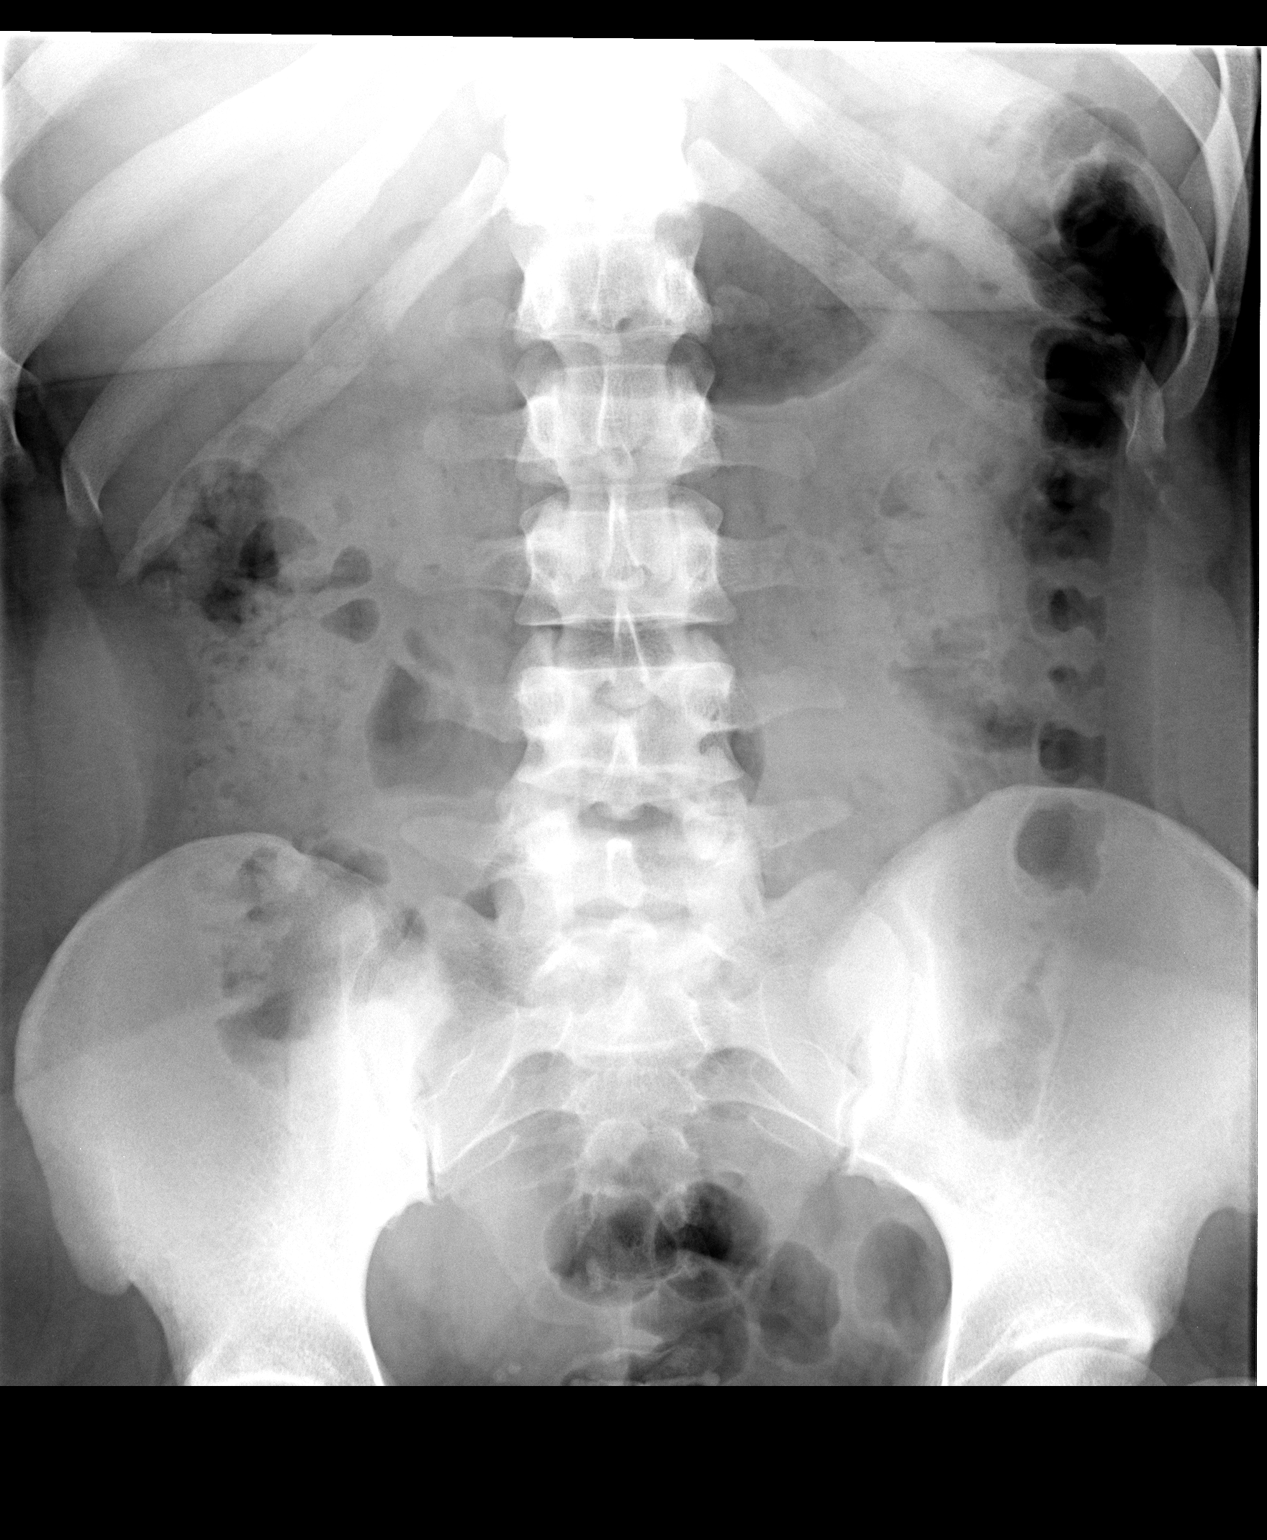

[1 of 1 positions shown; findings below may reference images not displayed]

FINDINGS: There is a nonobstructive bowel gas pattern.  No supine
evidence of free air.  No organomegaly or suspicious calcification.

No acute bony abnormality.  IUD in place.
IMPRESSION: No acute findings.

## 2015-02-21 ENCOUNTER — Emergency Department (HOSPITAL_COMMUNITY)
Admission: EM | Admit: 2015-02-21 | Discharge: 2015-02-21 | Disposition: A | Discharge status: Home / Self Care | Payer: PRIVATE HEALTH INSURANCE | Attending: Emergency Medicine | Admitting: Emergency Medicine

## 2015-02-21 ENCOUNTER — Encounter (HOSPITAL_COMMUNITY): Payer: Self-pay | Admitting: Emergency Medicine

## 2015-02-21 DIAGNOSIS — M545 Low back pain, unspecified: Secondary | ICD-10-CM

## 2015-02-21 DIAGNOSIS — Z720 Tobacco use: Secondary | ICD-10-CM | POA: Diagnosis not present

## 2015-02-21 DIAGNOSIS — Z872 Personal history of diseases of the skin and subcutaneous tissue: Secondary | ICD-10-CM | POA: Insufficient documentation

## 2015-02-21 DIAGNOSIS — Z3202 Encounter for pregnancy test, result negative: Secondary | ICD-10-CM | POA: Insufficient documentation

## 2015-02-21 LAB — COMPREHENSIVE METABOLIC PANEL
ALT: 18 U/L (ref 14–54)
AST: 17 U/L (ref 15–41)
Albumin: 4.1 g/dL (ref 3.5–5.0)
Alkaline Phosphatase: 54 U/L (ref 38–126)
Anion gap: 5 (ref 5–15)
BUN: 17 mg/dL (ref 6–20)
CO2: 24 mmol/L (ref 22–32)
Calcium: 8.6 mg/dL — ABNORMAL LOW (ref 8.9–10.3)
Chloride: 104 mmol/L (ref 101–111)
Creatinine, Ser: 0.82 mg/dL (ref 0.44–1.00)
GFR calc Af Amer: >60 mL/min (ref >60)
GFR calc non Af Amer: >60 mL/min (ref >60)
Glucose, Bld: 104 mg/dL — ABNORMAL HIGH (ref 65–99)
Potassium: 4.4 mmol/L (ref 3.5–5.1)
Sodium: 133 mmol/L — ABNORMAL LOW (ref 135–145)
Total Bilirubin: 0.3 mg/dL (ref 0.3–1.2)
Total Protein: 7.5 g/dL (ref 6.5–8.1)

## 2015-02-21 LAB — URINALYSIS, ROUTINE W REFLEX MICROSCOPIC
Bilirubin Urine: NEGATIVE
Glucose, UA: NEGATIVE mg/dL
Ketones, ur: NEGATIVE mg/dL
Leukocytes, UA: NEGATIVE
Nitrite: NEGATIVE
Protein, ur: NEGATIVE mg/dL
Specific Gravity, Urine: 1.024 (ref 1.005–1.030)
Urobilinogen, UA: 1 mg/dL (ref 0.0–1.0)
pH: 7 (ref 5.0–8.0)

## 2015-02-21 LAB — URINE MICROSCOPIC-ADD ON

## 2015-02-21 LAB — CBC WITH DIFFERENTIAL/PLATELET
Basophils Absolute: 0 10*3/uL (ref 0.0–0.1)
Basophils Relative: 0 % (ref 0–1)
Eosinophils Absolute: 0.2 10*3/uL (ref 0.0–0.7)
Eosinophils Relative: 2 % (ref 0–5)
HCT: 40.3 % (ref 36.0–46.0)
Hemoglobin: 13.4 g/dL (ref 12.0–15.0)
Lymphocytes Relative: 29 % (ref 12–46)
Lymphs Abs: 2.8 10*3/uL (ref 0.7–4.0)
MCH: 30.4 pg (ref 26.0–34.0)
MCHC: 33.3 g/dL (ref 30.0–36.0)
MCV: 91.4 fL (ref 78.0–100.0)
Monocytes Absolute: 0.6 10*3/uL (ref 0.1–1.0)
Monocytes Relative: 6 % (ref 3–12)
Neutro Abs: 6 10*3/uL (ref 1.7–7.7)
Neutrophils Relative %: 63 % (ref 43–77)
Platelets: 262 10*3/uL (ref 150–400)
RBC: 4.41 MIL/uL (ref 3.87–5.11)
RDW: 13.2 % (ref 11.5–15.5)
WBC: 9.6 10*3/uL (ref 4.0–10.5)

## 2015-02-21 LAB — POC URINE PREG, ED: Preg Test, Ur: NEGATIVE

## 2015-02-21 MED ORDER — IBUPROFEN 200 MG PO TABS: 400.0000 mg | ORAL_TABLET | Freq: Four times a day (QID) | ORAL | Status: DC | PRN

## 2015-02-21 NOTE — ED Provider Notes (Signed)
CSN: [id number]     Arrival date & time 02/21/15  0947 History   First MD Initiated Contact with Patient 02/21/15 [phone]     Chief Complaint  Patient presents with  . Back Pain     HPI   24 year old female presents with back pain. Patient reports the pain started in her left lower lumbar area yesterday, reports that she has not tried any over-the-counter therapies for the pain. Reports that was in the lower lumbar area yesterday with some radiation around into the flank. She describes it as a dull pain worse with palpation of the paralumbar muscles. Patient reports that her and her partner attempted having sexual intercourse for which she thought would improve the pain, but only made the pain worse due to him pushing on her back. Patient denies any abdominal pain other than the flank pain, nausea, vomiting, fever, changes in urine color clarity or characteristics, painful urination. She denies fever   Past Medical History  Diagnosis Date  . Eczema   . Seasonal allergies    Past Surgical History  Procedure Laterality Date  . Cesarean section     Family History  Problem Relation Age of Onset  . Schizophrenia Mother   . Bipolar disorder Mother   . Hypertension Mother   . Rheum arthritis Other   . Diabetes Other    History  Substance Use Topics  . Smoking status: Current Every Day Smoker -- 0.75 packs/day    Types: Cigarettes  . Smokeless tobacco: Not on file  . Alcohol Use: 1.2 oz/week    2 Cans of beer per week     Comment: 24 oz beer or 2 shots or liquor   OB History    No data available     Review of Systems  All other systems reviewed and are negative.     Allergies  Review of patient's allergies indicates no known allergies.  Home Medications   Prior to Admission medications   Medication Sig Start Date End Date Taking? Authorizing Provider  acetaminophen-codeine (TYLENOL #3) 300-30 MG per tablet Take 1-2 tablets by mouth every 6 (six) hours as needed for  pain. Patient not taking: Reported on 02/21/2015 01/10/13   Quita Skye, MD  ibuprofen (ADVIL) 200 MG tablet Take 2 tablets (400 mg total) by mouth every 6 (six) hours as needed. 02/21/15   Eyvonne Mechanic, PA-C  promethazine (PHENERGAN) 25 MG tablet Take 1 tablet (25 mg total) by mouth every 6 (six) hours as needed for nausea. Patient not taking: Reported on 02/21/2015 01/10/13   Quita Skye, MD  sulfamethoxazole-trimethoprim (BACTRIM DS) 800-160 MG per tablet Take 1 tablet by mouth 2 (two) times daily. Patient not taking: Reported on 02/21/2015 06/22/13   Rhetta Mura, MD   BP 121/68 mmHg  Pulse 67  Temp(Src) 97.7 F (36.5 C) (Oral)  Resp 18  Ht 5\' 7"  (1.702 m)  Wt 280 lb (127.007 kg)  BMI 43.84 kg/m2  SpO2 100%  LMP 02/01/2015 Physical Exam  Constitutional: She is oriented to person, place, and time. She appears well-developed and well-nourished.  HENT:  Head: Normocephalic and atraumatic.  Eyes: Conjunctivae are normal. Pupils are equal, round, and reactive to light. Right eye exhibits no discharge. Left eye exhibits no discharge. No scleral icterus.  Neck: Normal range of motion. No JVD present. No tracheal deviation present.  Pulmonary/Chest: Effort normal. No stridor.  Abdominal: Soft. She exhibits no distension and no mass. There is no tenderness. There is no rebound, no guarding  and no CVA tenderness.  Musculoskeletal:  Tenderness to palpation of left lumbar soft tissue, no signs of trauma or infection. Full ROM neck , back, hips, knees. Pain with forward hip flexion  Neurological: She is alert and oriented to person, place, and time. No sensory deficit. Coordination normal.  LE distal sensation grossly intact  Psychiatric: She has a normal mood and affect. Her behavior is normal. Judgment and thought content normal.  Nursing note and vitals reviewed.   ED Course  Procedures (including critical care time) Labs Review Labs Reviewed  COMPREHENSIVE METABOLIC PANEL -  Abnormal; Notable for the following:    Sodium 133 (*)    Glucose, Bld 104 (*)    Calcium 8.6 (*)    All other components within normal limits  URINALYSIS, ROUTINE W REFLEX MICROSCOPIC (NOT AT [facility]) - Abnormal; Notable for the following:    APPearance CLOUDY (*)    Hgb urine dipstick TRACE (*)    All other components within normal limits  URINE MICROSCOPIC-ADD ON - Abnormal; Notable for the following:    Squamous Epithelial / LPF MANY (*)    Bacteria, UA FEW (*)    All other components within normal limits  CBC WITH DIFFERENTIAL/PLATELET  POC URINE PREG, ED    Imaging Review No results found.   EKG Interpretation None      MDM   Final diagnoses:  Right-sided low back pain without sciatica    Labs:POC urine preg, urinalysis, cbc, cmp- trace Hgb in urine, not pregnant   Imaging:none  Consults: none  Therapeutics: none  Plan: Pt presents with left lower back pain made worse by activity and palpation. Pt does endorse some radiation into the right flank; no cva tenderness, no gross blood in urine, urinary symptoms. Unlikely pyelo, kidney stone. Most likely MSK pain. Ibuprofen as needed for pain, heat, massage, rest. Strict return precautions. Follow-up with PCP in 1 week if symptoms persist, sooner as needed. Pt verbalized understanding and agreement to todays plan.       Eyvonne Mechanic, PA-C 02/23/15 1831  Jerelyn Scott, MD 02/27/15 [phone]

## 2015-02-21 NOTE — Discharge Instructions (Signed)
Please monitor for new or worsening signs or symptoms, return immediately if any present. Please follow up with primary care provider for further evaluation and management.

## 2015-02-21 NOTE — ED Notes (Signed)
Pt c/o lower R back pain radiating around to low abd onset yesterday. +n/d

## 2016-04-10 ENCOUNTER — Emergency Department (HOSPITAL_COMMUNITY)
Admission: EM | Admit: 2016-04-10 | Discharge: 2016-04-10 | Disposition: A | Discharge status: Home / Self Care | Payer: PRIVATE HEALTH INSURANCE | Attending: Emergency Medicine | Admitting: Emergency Medicine

## 2016-04-10 ENCOUNTER — Encounter (HOSPITAL_COMMUNITY): Payer: Self-pay | Admitting: Emergency Medicine

## 2016-04-10 DIAGNOSIS — N898 Other specified noninflammatory disorders of vagina: Secondary | ICD-10-CM

## 2016-04-10 DIAGNOSIS — F17210 Nicotine dependence, cigarettes, uncomplicated: Secondary | ICD-10-CM | POA: Insufficient documentation

## 2016-04-10 DIAGNOSIS — R42 Dizziness and giddiness: Secondary | ICD-10-CM | POA: Insufficient documentation

## 2016-04-10 HISTORY — DX: Disorder of thyroid, unspecified: E07.9

## 2016-04-10 LAB — COMPREHENSIVE METABOLIC PANEL
ALK PHOS: 60 U/L (ref 38–126)
ALT: 14 U/L (ref 14–54)
ANION GAP: 7 (ref 5–15)
AST: 16 U/L (ref 15–41)
Albumin: 4.1 g/dL (ref 3.5–5.0)
BUN: 12 mg/dL (ref 6–20)
CALCIUM: 8.7 mg/dL — AB (ref 8.9–10.3)
CO2: 24 mmol/L (ref 22–32)
Chloride: 105 mmol/L (ref 101–111)
Creatinine, Ser: 0.88 mg/dL (ref 0.44–1.00)
GFR calc Af Amer: >60 mL/min (ref >60)
GFR calc non Af Amer: >60 mL/min (ref >60)
Glucose, Bld: 91 mg/dL (ref 65–99)
Potassium: 4.3 mmol/L (ref 3.5–5.1)
Sodium: 136 mmol/L (ref 135–145)
Total Bilirubin: 0.5 mg/dL (ref 0.3–1.2)
Total Protein: 7.1 g/dL (ref 6.5–8.1)

## 2016-04-10 LAB — LIPASE, BLOOD: LIPASE: 31 U/L (ref 11–51)

## 2016-04-10 LAB — CBC
HCT: 40.2 % (ref 36.0–46.0)
Hemoglobin: 13.4 g/dL (ref 12.0–15.0)
MCH: 30 pg (ref 26.0–34.0)
MCHC: 33.3 g/dL (ref 30.0–36.0)
MCV: 90.1 fL (ref 78.0–100.0)
Platelets: 278 10*3/uL (ref 150–400)
RBC: 4.46 MIL/uL (ref 3.87–5.11)
RDW: 13.7 % (ref 11.5–15.5)
WBC: 6.7 10*3/uL (ref 4.0–10.5)

## 2016-04-10 LAB — URINALYSIS, ROUTINE W REFLEX MICROSCOPIC
Bilirubin Urine: NEGATIVE
Glucose, UA: NEGATIVE mg/dL
Ketones, ur: NEGATIVE mg/dL
NITRITE: NEGATIVE
Protein, ur: NEGATIVE mg/dL
Specific Gravity, Urine: 1.022 (ref 1.005–1.030)
pH: 6 (ref 5.0–8.0)

## 2016-04-10 LAB — WET PREP, GENITAL
Clue Cells Wet Prep HPF POC: NONE SEEN
Sperm: NONE SEEN
TRICH WET PREP: NONE SEEN
YEAST WET PREP: NONE SEEN

## 2016-04-10 LAB — URINE MICROSCOPIC-ADD ON

## 2016-04-10 LAB — PREGNANCY, URINE: PREG TEST UR: NEGATIVE

## 2016-04-10 MED ORDER — AZITHROMYCIN 250 MG PO TABS
1000.0000 mg | ORAL_TABLET | Freq: Once | ORAL | Status: AC
  Administered 2016-04-10: 1000 mg via ORAL
  Filled 2016-04-10: qty 4

## 2016-04-10 MED ORDER — CEFTRIAXONE SODIUM 250 MG IJ SOLR
250.0000 mg | Freq: Once | INTRAMUSCULAR | Status: AC
  Administered 2016-04-10: 250 mg via INTRAMUSCULAR
  Filled 2016-04-10: qty 250

## 2016-04-10 MED ORDER — STERILE WATER FOR INJECTION IJ SOLN
INTRAMUSCULAR | Status: AC
  Administered 2016-04-10: 1 mL
  Filled 2016-04-10: qty 10

## 2016-04-10 NOTE — ED Notes (Signed)
Pt assisted to restroom to collect urine sample

## 2016-04-10 NOTE — ED Notes (Signed)
Gave patient 4 apple juices. 2 bottled waters

## 2016-04-10 NOTE — ED Triage Notes (Addendum)
Patient presents for lightheadedness, HA, nausea and bilateral lower abdominal pain intermittently x1 week. Denies urinary symptoms, N/V, weakness, neck pain, fever, SOB. States, "When I lie down, it feels like I'm not getting enough oxygen to my head". Patient reports history of hyperthyroidism, but hasn't been able to take medications x2-3 months.

## 2016-04-10 NOTE — ED Provider Notes (Signed)
[facility]-EMERGENCY DEPT Provider Note   CSN: [id number] Arrival date & time: 04/10/16  1120  First Provider Contact:  First MD Initiated Contact with Patient 04/10/16 1244       History   Chief Complaint Chief Complaint  Patient presents with  . Dizziness  . Abdominal Pain    HPI Debbie Fisher is a 25 y.o. female.  The history is provided by the patient and medical records. No language interpreter was used.  Dizziness  Associated symptoms: headaches   Associated symptoms: no blood in stool, no diarrhea, no nausea, no shortness of breath and no vomiting   Abdominal Pain   Associated symptoms include headaches. Pertinent negatives include fever, diarrhea, nausea, vomiting, constipation, dysuria and myalgias.   Debbie Fisher is a 25 y.o. female  with a PMH of hyperthyroidism who presents to the Emergency Department complaining of intermittent sharp lower abdominal pain over the last several weeks. No nausea, vomiting, diarrhea, constipation, vaginal discharge, dysuria, urinary hesitancy/frequency. No medications taken prior to arrival for symptoms. Does admit to unprotected intercourse with long term partner. LMP approx. 3 weeks ago. Patient also endorses intermittent generalized headache and nasal congestion as well. Denies cough, fevers, chest pain, shortness of breath, visual changes.   Past Medical History:  Diagnosis Date  . Eczema   . Seasonal allergies   . Thyroid disease    hyperthyroidism     There are no active problems to display for this patient.   Past Surgical History:  Procedure Laterality Date  . CESAREAN SECTION      OB History    No data available       Home Medications    Prior to Admission medications   Medication Sig Start Date End Date Taking? Authorizing Provider  acetaminophen (TYLENOL) 500 MG tablet Take 1,000 mg by mouth every 6 (six) hours as needed for mild pain, moderate pain, fever or headache.   Yes Historical Provider, MD    levothyroxine (SYNTHROID, LEVOTHROID) 75 MCG tablet Take 75 mcg by mouth daily before breakfast.   Yes Historical Provider, MD    Family History Family History  Problem Relation Age of Onset  . Rheum arthritis Other   . Diabetes Other   . Schizophrenia Mother   . Bipolar disorder Mother   . Hypertension Mother     Social History Social History  Substance Use Topics  . Smoking status: Current Every Day Smoker    Packs/day: 0.75    Types: Cigarettes  . Smokeless tobacco: Never Used  . Alcohol use 1.2 oz/week    2 Cans of beer per week     Comment: 24 oz beer or 2 shots or liquor     Allergies   Review of patient's allergies indicates no known allergies.   Review of Systems Review of Systems  Constitutional: Negative for chills and fever.  HENT: Positive for congestion.   Eyes: Negative for visual disturbance.  Respiratory: Negative for cough and shortness of breath.   Cardiovascular: Negative.   Gastrointestinal: Positive for abdominal pain. Negative for blood in stool, constipation, diarrhea, nausea and vomiting.  Genitourinary: Negative for dysuria, urgency, vaginal bleeding, vaginal discharge and vaginal pain.  Musculoskeletal: Negative for back pain and myalgias.  Skin: Negative for color change.  Neurological: Positive for headaches.     Physical Exam Updated Vital Signs BP 143/85   Pulse 79   Temp 98 F (36.7 C) (Oral)   Resp 18   Ht 5\' 7"  (1.702 m)  Wt 127 kg   LMP 01/09/2016 (LMP Unknown)   SpO2 98%   BMI 43.85 kg/m   Physical Exam  Constitutional: She is oriented to person, place, and time. She appears well-developed and well-nourished. No distress.  HENT:  Head: Normocephalic and atraumatic.  OP with erythema, no exudates or tonsillar hypertrophy. + boggy turbinates.   Cardiovascular: Normal rate, regular rhythm, normal heart sounds and intact distal pulses.  Exam reveals no gallop and no friction rub.   No murmur heard. Pulmonary/Chest:  Effort normal and breath sounds normal. No respiratory distress. She has no wheezes. She has no rales. She exhibits no tenderness.  Abdominal: Soft. Bowel sounds are normal. She exhibits no distension. There is tenderness (Mild tenderness to palpation of suprapubic area and left lower quadrant ). There is no rebound and no guarding.  Genitourinary:  Genitourinary Comments: Chaperone present for exam. + moderate amount of white discharge. No rashes, lesions, or tenderness to external genitalia. No erythema, injury, or tenderness to vaginal mucosa. No bleeding within vaginal vault. No adnexal masses, tenderness, or fullness. No CMT.   Musculoskeletal: She exhibits no edema.  Neurological: She is alert and oriented to person, place, and time.  Skin: Skin is warm and dry.  Nursing note and vitals reviewed.    ED Treatments / Results  Labs (all labs ordered are listed, but only abnormal results are displayed) Labs Reviewed  WET PREP, GENITAL - Abnormal; Notable for the following:       Result Value   WBC, Wet Prep HPF POC MANY (*)    All other components within normal limits  COMPREHENSIVE METABOLIC PANEL - Abnormal; Notable for the following:    Calcium 8.7 (*)    All other components within normal limits  URINALYSIS, ROUTINE W REFLEX MICROSCOPIC (NOT AT [facility]) - Abnormal; Notable for the following:    APPearance CLOUDY (*)    Hgb urine dipstick TRACE (*)    Leukocytes, UA MODERATE (*)    All other components within normal limits  URINE MICROSCOPIC-ADD ON - Abnormal; Notable for the following:    Squamous Epithelial / LPF 6-30 (*)    Bacteria, UA MANY (*)    All other components within normal limits  URINE CULTURE  LIPASE, BLOOD  CBC  PREGNANCY, URINE  GC/CHLAMYDIA PROBE AMP ([place]) NOT AT [facility]    EKG  EKG Interpretation None       Radiology No results found.  Procedures Procedures (including critical care time)  Medications Ordered in ED Medications  cefTRIAXone  (ROCEPHIN) injection 250 mg (250 mg Intramuscular Given 04/10/16 1525)  azithromycin (ZITHROMAX) tablet 1,000 mg (1,000 mg Oral Given 04/10/16 1525)  sterile water (preservative free) injection (1 mL  Given 04/10/16 1525)     Initial Impression / Assessment and Plan / ED Course  I have reviewed the triage vital signs and the nursing notes.  Pertinent labs & imaging results that were available during my care of the patient were reviewed by me and considered in my medical decision making (see chart for details).  Clinical Course   Debbie Fisher is a 25 y.o. female who presents to ED for intermittent sharp abdominal pain over the last several weeks. She also endorses intermittent headache and nasal congestion over the last several weeks as well. On exam, non-surgical abdomen with mild tenderness to palpation of suprapubic and left-sided abdomen. Pelvic performed with moderate amount of white discharge. No adnexal or cervical motion tenderness.   Labs reviewed and reassuring.  Urine with moderate leuks, 6-30 squamous, 6-30 white cells. No urinary symptoms, therefore will send urine for culture and hold treatment.   Wet prep with many bacteria, otherwise unremarkable. G&C sent. Discussed prophylactic ABX treatment with patient who would like to get ABX here today.   Repeat abdominal exam unchanged. Patient is nontoxic, nonseptic appearing, in no apparent distress. Eating [employer] in the room.   Follow up OBGYN. Return precautions given. All questions answered.   Final Clinical Impressions(s) / ED Diagnoses   Final diagnoses:  Vaginal discharge    New Prescriptions Discharge Medication List as of 04/10/2016  3:49 PM       [facility] Pilcher Ward, PA-C 04/10/16 1625    Tilden FossaElizabeth Rees, MD 04/11/16 [phone]

## 2016-04-10 NOTE — ED Notes (Signed)
Pelvic cart set PA notified

## 2016-04-10 NOTE — Discharge Instructions (Signed)
Use a condom with every sexual encounter Follow up with your doctor or OBGYN in regards to today's visit.  I have listed an OBGYN if you do not have one.  Please return to the ER for worsening symptoms, high fevers or persistent vomiting.  You have been tested for chlamydia and gonorrhea. These results will be available in approximately 3 days. You will be notified if they are positive.   It is very important to practice safe sex and use condoms when sexually active. If your results are positive you need to notify all sexual partners so they can be treated as well. T  SEEK IMMEDIATE MEDICAL CARE IF:  You develop an oral temperature above 102 F (38.9 C), not controlled by medications or lasting more than 2 days.  You develop an increase in pain.  You develop any type of abnormal discharge.  You develop vaginal bleeding and it is not time for your period.  You develop painful intercourse.

## 2016-04-11 LAB — GC/CHLAMYDIA PROBE AMP (~~LOC~~) NOT AT ARMC
Chlamydia: NEGATIVE
Neisseria Gonorrhea: NEGATIVE

## 2017-01-03 ENCOUNTER — Emergency Department (HOSPITAL_COMMUNITY)
Admission: EM | Admit: 2017-01-03 | Discharge: 2017-01-03 | Disposition: A | Discharge status: Home / Self Care | Payer: Self-pay | Attending: Emergency Medicine | Admitting: Emergency Medicine

## 2017-01-03 ENCOUNTER — Encounter (HOSPITAL_COMMUNITY): Payer: Self-pay | Admitting: Emergency Medicine

## 2017-01-03 ENCOUNTER — Emergency Department (HOSPITAL_COMMUNITY): Payer: Self-pay

## 2017-01-03 DIAGNOSIS — F17210 Nicotine dependence, cigarettes, uncomplicated: Secondary | ICD-10-CM | POA: Insufficient documentation

## 2017-01-03 DIAGNOSIS — R1010 Upper abdominal pain, unspecified: Secondary | ICD-10-CM

## 2017-01-03 DIAGNOSIS — Z79899 Other long term (current) drug therapy: Secondary | ICD-10-CM | POA: Insufficient documentation

## 2017-01-03 DIAGNOSIS — R1011 Right upper quadrant pain: Secondary | ICD-10-CM

## 2017-01-03 LAB — URINALYSIS, ROUTINE W REFLEX MICROSCOPIC
Bilirubin Urine: NEGATIVE
GLUCOSE, UA: NEGATIVE mg/dL
Ketones, ur: NEGATIVE mg/dL
Leukocytes, UA: NEGATIVE
NITRITE: NEGATIVE
Protein, ur: NEGATIVE mg/dL
SPECIFIC GRAVITY, URINE: 1.023 (ref 1.005–1.030)
pH: 6 (ref 5.0–8.0)

## 2017-01-03 LAB — COMPREHENSIVE METABOLIC PANEL
ALK PHOS: 61 U/L (ref 38–126)
ALT: 13 U/L — AB (ref 14–54)
AST: 15 U/L (ref 15–41)
Albumin: 4 g/dL (ref 3.5–5.0)
Anion gap: 4 — ABNORMAL LOW (ref 5–15)
BUN: 12 mg/dL (ref 6–20)
CALCIUM: 8.6 mg/dL — AB (ref 8.9–10.3)
CHLORIDE: 107 mmol/L (ref 101–111)
CO2: 26 mmol/L (ref 22–32)
CREATININE: 0.72 mg/dL (ref 0.44–1.00)
GFR calc Af Amer: >60 mL/min (ref >60)
Glucose, Bld: 89 mg/dL (ref 65–99)
Potassium: 4.3 mmol/L (ref 3.5–5.1)
SODIUM: 137 mmol/L (ref 135–145)
Total Bilirubin: 0.5 mg/dL (ref 0.3–1.2)
Total Protein: 7 g/dL (ref 6.5–8.1)

## 2017-01-03 LAB — CBC WITH DIFFERENTIAL/PLATELET
BASOS ABS: 0 10*3/uL (ref 0.0–0.1)
BASOS PCT: 1 %
EOS ABS: 0.6 10*3/uL (ref 0.0–0.7)
Eosinophils Relative: 7 %
HCT: 41.5 % (ref 36.0–46.0)
Hemoglobin: 13.7 g/dL (ref 12.0–15.0)
LYMPHS PCT: 22 %
Lymphs Abs: 1.8 10*3/uL (ref 0.7–4.0)
MCH: 31 pg (ref 26.0–34.0)
MCHC: 33 g/dL (ref 30.0–36.0)
MCV: 93.9 fL (ref 78.0–100.0)
MONO ABS: 0.6 10*3/uL (ref 0.1–1.0)
Monocytes Relative: 8 %
Neutro Abs: 5 10*3/uL (ref 1.7–7.7)
Neutrophils Relative %: 62 %
PLATELETS: 267 10*3/uL (ref 150–400)
RBC: 4.42 MIL/uL (ref 3.87–5.11)
RDW: 14.1 % (ref 11.5–15.5)
WBC: 7.9 10*3/uL (ref 4.0–10.5)

## 2017-01-03 LAB — I-STAT BETA HCG BLOOD, ED (MC, WL, AP ONLY)

## 2017-01-03 LAB — LIPASE, BLOOD: Lipase: 26 U/L (ref 11–51)

## 2017-01-03 MED ORDER — OMEPRAZOLE 20 MG PO CPDR: 20.0000 mg | DELAYED_RELEASE_CAPSULE | Freq: Every day | ORAL | 0 refills | Status: AC

## 2017-01-03 MED ORDER — GI COCKTAIL ~~LOC~~: 30.0000 mL | Freq: Once | ORAL | Status: DC

## 2017-01-03 NOTE — ED Notes (Signed)
Bed: WA06 Expected date:  Expected time:  Means of arrival:  Comments: 

## 2017-01-03 NOTE — ED Notes (Signed)
Currently drinking fluids. No pain.

## 2017-01-03 NOTE — ED Triage Notes (Signed)
Patient in today with complaints of upper abdominal pain that started this morning. Nausea, no vomiting. LMP "April".

## 2017-01-03 NOTE — ED Provider Notes (Signed)
[facility]-EMERGENCY DEPT Provider Note   CSN: [id number] Arrival date & time: 01/03/17  1010     History   Chief Complaint Chief Complaint  Patient presents with  . Abdominal Pain    HPI Debbie Fisher is a 26 y.o. female.  26 year old female who presents with abdominal pain. This morning, the patient began having upper abdominal pain that is constant with occasional waves of severe pain. The pain is nonradiating and associated with nausea but no vomiting. No diarrhea, constipation, or blood in her stool. She denies any urinary symptoms or vaginal bleeding/discharge. No fevers or recent illness. She does report seasonal allergy symptoms. Last menstrual period was at the beginning of April.   The history is provided by the patient.  Abdominal Pain      Past Medical History:  Diagnosis Date  . Eczema   . Seasonal allergies   . Thyroid disease    hyperthyroidism     There are no active problems to display for this patient.   Past Surgical History:  Procedure Laterality Date  . CESAREAN SECTION      OB History    No data available       Home Medications    Prior to Admission medications   Medication Sig Start Date End Date Taking? Authorizing Provider  cetirizine (ZYRTEC) 10 MG tablet Take 10 mg by mouth daily.   Yes [provider]  dextromethorphan-guaiFENesin (MUCINEX DM) 30-600 MG 12hr tablet Take 2 tablets by mouth 2 (two) times daily.   Yes [provider]  omeprazole (PRILOSEC) 20 MG capsule Take 1 capsule (20 mg total) by mouth daily. 01/03/17 01/17/17  Little, Ambrose Finland, MD    Family History Family History  Problem Relation Age of Onset  . Rheum arthritis Other   . Diabetes Other   . Schizophrenia Mother   . Bipolar disorder Mother   . Hypertension Mother     Social History Social History  Substance Use Topics  . Smoking status: Current Every Day Smoker    Packs/day: 0.75    Types: Cigarettes  . Smokeless tobacco: Never  Used  . Alcohol use 1.2 oz/week    2 Cans of beer per week     Comment: 24 oz beer or 2 shots or liquor     Allergies   Patient has no known allergies.   Review of Systems Review of Systems  Gastrointestinal: Positive for abdominal pain.   All other systems reviewed and are negative except that which was mentioned in HPI  Physical Exam Updated Vital Signs BP 137/90 (BP Location: Right Arm)   Pulse 97   Temp 98.6 F (37 C) (Oral)   Resp 18   SpO2 99%   Physical Exam  Constitutional: She is oriented to person, place, and time. She appears well-developed and well-nourished.  uncomfortable  HENT:  Head: Normocephalic and atraumatic.  Moist mucous membranes  Eyes: Conjunctivae are normal. Pupils are equal, round, and reactive to light.  Neck: Neck supple.  Cardiovascular: Normal rate, regular rhythm and normal heart sounds.   No murmur heard. Pulmonary/Chest: Effort normal and breath sounds normal.  Abdominal: Soft. Bowel sounds are normal. She exhibits no distension. There is tenderness (RUQ, midepigastrium). There is no rebound and no guarding.  Musculoskeletal: She exhibits no edema.  Neurological: She is alert and oriented to person, place, and time.  Fluent speech, normal gait  Skin: Skin is warm and dry.  Psychiatric: She has a normal mood and affect. Judgment  normal.  Nursing note and vitals reviewed.    ED Treatments / Results  Labs (all labs ordered are listed, but only abnormal results are displayed) Labs Reviewed  URINALYSIS, ROUTINE W REFLEX MICROSCOPIC - Abnormal; Notable for the following:       Result Value   APPearance HAZY (*)    Hgb urine dipstick SMALL (*)    Bacteria, UA RARE (*)    Squamous Epithelial / LPF 6-30 (*)    All other components within normal limits  COMPREHENSIVE METABOLIC PANEL - Abnormal; Notable for the following:    Calcium 8.6 (*)    ALT 13 (*)    Anion gap 4 (*)    All other components within normal limits  CBC WITH  DIFFERENTIAL/PLATELET  LIPASE, BLOOD  I-STAT BETA HCG BLOOD, ED ([facility], [facility], [facility] ONLY)    EKG  EKG Interpretation None       Radiology [place] Abdomen Limited Ruq  Result Date: 01/03/2017 CLINICAL DATA:  Right upper quadrant for 1 hour. EXAM: US ABDOMEN LIMITED - RIGHT UPPER QUADRANT COMPARISON:  08/01/2011 plain films. FINDINGS: Gallbladder: No gallstones or wall thickening visualized. No sonographic Murphy sign noted by sonographer. Common bile duct: Diameter: Normal, 2 mm. Liver: No focal lesion identified. Within normal limits in parenchymal echogenicity. IMPRESSION: Normal right upper quadrant ultrasound. Electronically Signed   By: Jeronimo GreavesKyle  Talbot M.D.   On: 01/03/2017 11:48    Procedures Procedures (including critical care time)  Medications Ordered in ED Medications  gi cocktail (Maalox,Lidocaine,Donnatal) (30 mLs Oral Not Given 01/03/17 1216)     Initial Impression / Assessment and Plan / ED Course  I have reviewed the triage vital signs and the nursing notes.  Pertinent labs & imaging results that were available during my care of the patient were reviewed by me and considered in my medical decision making (see chart for details).    PT w/ constant upper abdominal pain since this morning. She was uncomfortable on exam but with stable vital signs. Epigastric and right upper quadrant tenderness noted. Obtained above lab work as well as a right upper quadrant ultrasound to evaluate gallbladder.  Labwork shows negative UA, unremarkable CBC and CMP, normal lipase. Ultrasound shows normal gallbladder. After receiving GI cocktail, the patient's pain resolved and she was resting comfortably. She has tolerated liquids here with no problems. Started the patient on PPI and discussed dietary options to help with symptoms. As she has no lower abdominal tenderness and no significant associated symptoms I feel she is safe for discharge. I have discussed return precautions including severe vomiting,  blood in stool, worsening abdominal pain, or fevers. Patient voiced understanding and was discharged in satisfactory condition. Final Clinical Impressions(s) / ED Diagnoses   Final diagnoses:  RUQ pain  Pain of upper abdomen    New Prescriptions New Prescriptions   OMEPRAZOLE (PRILOSEC) 20 MG CAPSULE    Take 1 capsule (20 mg total) by mouth daily.     Little, Ambrose Finlandachel Morgan, MD 01/03/17 [phone]

## 2017-07-16 ENCOUNTER — Emergency Department (HOSPITAL_COMMUNITY)
Admission: EM | Admit: 2017-07-16 | Discharge: 2017-07-16 | Disposition: A | Discharge status: Home / Self Care | Payer: PRIVATE HEALTH INSURANCE | Attending: Emergency Medicine | Admitting: Emergency Medicine

## 2017-07-16 ENCOUNTER — Encounter (HOSPITAL_COMMUNITY): Payer: Self-pay | Admitting: Family Medicine

## 2017-07-16 ENCOUNTER — Emergency Department (HOSPITAL_COMMUNITY): Payer: PRIVATE HEALTH INSURANCE

## 2017-07-16 DIAGNOSIS — S99911A Unspecified injury of right ankle, initial encounter: Secondary | ICD-10-CM

## 2017-07-16 DIAGNOSIS — Y929 Unspecified place or not applicable: Secondary | ICD-10-CM | POA: Insufficient documentation

## 2017-07-16 DIAGNOSIS — M25571 Pain in right ankle and joints of right foot: Secondary | ICD-10-CM | POA: Insufficient documentation

## 2017-07-16 DIAGNOSIS — Z79899 Other long term (current) drug therapy: Secondary | ICD-10-CM | POA: Insufficient documentation

## 2017-07-16 DIAGNOSIS — Y999 Unspecified external cause status: Secondary | ICD-10-CM | POA: Insufficient documentation

## 2017-07-16 DIAGNOSIS — Y9301 Activity, walking, marching and hiking: Secondary | ICD-10-CM | POA: Insufficient documentation

## 2017-07-16 DIAGNOSIS — F17210 Nicotine dependence, cigarettes, uncomplicated: Secondary | ICD-10-CM | POA: Insufficient documentation

## 2017-07-16 DIAGNOSIS — W109XXA Fall (on) (from) unspecified stairs and steps, initial encounter: Secondary | ICD-10-CM | POA: Insufficient documentation

## 2017-07-16 MED ORDER — ACETAMINOPHEN 325 MG PO TABS
650.0000 mg | ORAL_TABLET | Freq: Once | ORAL | Status: AC
  Administered 2017-07-16: 650 mg via ORAL
  Filled 2017-07-16: qty 2

## 2017-07-16 MED ORDER — OXYCODONE-ACETAMINOPHEN 5-325 MG PO TABS
1.0000 | ORAL_TABLET | Freq: Once | ORAL | Status: AC
  Administered 2017-07-16: 1 via ORAL
  Filled 2017-07-16: qty 1

## 2017-07-16 MED ORDER — TRAMADOL HCL 50 MG PO TABS: 50.0000 mg | ORAL_TABLET | Freq: Three times a day (TID) | ORAL | 0 refills | Status: AC

## 2017-07-16 NOTE — ED Provider Notes (Signed)
[facility]PT Provider Note   CSN: [id number] Arrival date & time: 07/16/17  1701     History   Chief Complaint Chief Complaint  Patient presents with  . Ankle Injury    HPI Debbie Fisher is a 26 y.o. female presents to the ED for evaluation of right ankle pain, swelling onset PTA. States she was going down some steps moving a couch when she slipped and fell, inverting her right ankle. Has avoided weightbearing on this foot due to pain. No interventions prior to arrival. No previous history of injury or surgery to the joints. No head trauma during fall. No numbness or tingling to toes.  HPI  Past Medical History:  Diagnosis Date  . Eczema   . Seasonal allergies   . Thyroid disease    hyperthyroidism     There are no active problems to display for this patient.   Past Surgical History:  Procedure Laterality Date  . CESAREAN SECTION      OB History    No data available       Home Medications    Prior to Admission medications   Medication Sig Start Date End Date Taking? Authorizing Provider  cetirizine (ZYRTEC) 10 MG tablet Take 10 mg by mouth daily.    [provider]  dextromethorphan-guaiFENesin (MUCINEX DM) 30-600 MG 12hr tablet Take 2 tablets by mouth 2 (two) times daily.    [provider]  omeprazole (PRILOSEC) 20 MG capsule Take 1 capsule (20 mg total) by mouth daily. 01/03/17 01/17/17  Little, Ambrose Finlandachel Morgan, MD  traMADol (ULTRAM) 50 MG tablet Take 1 tablet (50 mg total) every 8 (eight) hours for 2 days by mouth. 07/16/17 07/18/17  Liberty HandyGibbons, Claudia J, PA-C    Family History Family History  Problem Relation Age of Onset  . Rheum arthritis Other   . Diabetes Other   . Schizophrenia Mother   . Bipolar disorder Mother   . Hypertension Mother     Social History Social History   Tobacco Use  . Smoking status: Current Every Day Smoker    Packs/day: 0.75    Types: Cigarettes  . Smokeless tobacco:  Never Used  Substance Use Topics  . Alcohol use: Yes    Alcohol/week: 1.2 oz    Types: 2 Cans of beer per week    Comment: 24 oz beer or 2 shots or liquor  . Drug use: No     Allergies   Patient has no known allergies.   Review of Systems Review of Systems  Musculoskeletal: Positive for arthralgias.  All other systems reviewed and are negative.    Physical Exam Updated Vital Signs BP 140/78 (BP Location: Right Arm)   Pulse 72   Temp 98.5 F (36.9 C) (Oral)   Resp 18   Ht 5\' 7"  (1.702 m)   Wt 90.7 kg (200 lb)   LMP 07/13/2017   SpO2 100%   BMI 31.32 kg/m   Physical Exam  Constitutional: She is oriented to person, place, and time. She appears well-developed and well-nourished. No distress.  NAD.  HENT:  Head: Normocephalic and atraumatic.  Right Ear: External ear normal.  Left Ear: External ear normal.  Nose: Nose normal.  Eyes: Conjunctivae and EOM are normal. No scleral icterus.  Neck: Normal range of motion. Neck supple.  Cardiovascular: Normal rate, regular rhythm and normal heart sounds.  No murmur heard. Pulmonary/Chest: Effort normal and breath sounds normal. She has no wheezes.  Musculoskeletal: Normal range  of motion. She exhibits edema and tenderness. She exhibits no deformity.  Focal tenderness and edema to bilateral malleoli, worst medially Pain with eversion Decreased AROM of ankle 2/2 pain Able to wiggle toes w/o pain No right foot/ankle ecchymosis, warmth, erythema or skin abrasions. No bony tenderness over navicular bone or 5th metatarsal base.   Achilles tendon is non tender. Negative anterior and posterior drawer.  Negative Talar tilt.  Negative syndesmosis squeeze test.   Neurological: She is alert and oriented to person, place, and time.  Skin: Skin is warm and dry. Capillary refill takes less than 2 seconds.  Psychiatric: She has a normal mood and affect. Her behavior is normal. Judgment and thought content normal.  Nursing note and  vitals reviewed.    ED Treatments / Results  Labs (all labs ordered are listed, but only abnormal results are displayed) Labs Reviewed - No data to display  EKG  EKG Interpretation None       Radiology Dg Ankle Complete Right  Result Date: 07/16/2017 CLINICAL DATA:  Fall with twisting injury to the ankle EXAM: RIGHT ANKLE - COMPLETE 3+ VIEW COMPARISON:  None. FINDINGS: Lateral soft tissue swelling.  No fracture or malalignment. IMPRESSION: Soft tissue swelling.  No acute osseous abnormality. Electronically Signed   By: Jasmine PangKim  Fujinaga M.D.   On: 07/16/2017 18:24    Procedures Procedures (including critical care time)  Medications Ordered in ED Medications  oxyCODONE-acetaminophen (PERCOCET/ROXICET) 5-325 MG per tablet 1 tablet (1 tablet Oral Given 07/16/17 1841)  acetaminophen (TYLENOL) tablet 650 mg (650 mg Oral Given 07/16/17 1842)     Initial Impression / Assessment and Plan / ED Course  I have reviewed the triage vital signs and the nursing notes.  Pertinent labs & imaging results that were available during my care of the patient were reviewed by me and considered in my medical decision making (see chart for details).    26 year old presents with right ankle pain after mechanical fall and inversion. X-ray is negative for acute fracture. No skin break or injury. Extremities neurovascularly intact. She does have moderate edema and tenderness to bilateral malleoli, worst medially. Will discharge with brace, crutches and high-dose NSAIDs. She is to follow up with orthopedist if symptoms persist.  Final Clinical Impressions(s) / ED Diagnoses   Final diagnoses:  Injury of right ankle, initial encounter    ED Discharge Orders        Ordered    traMADol (ULTRAM) 50 MG tablet  Every 8 hours     07/16/17 1908       Liberty HandyGibbons, Claudia J, PA-C 07/16/17 Haskel Schroeder1920    Nanavati, Ankit, MD 07/17/17 [id number]

## 2017-07-16 NOTE — ED Notes (Signed)
Notified ortho tech of patients orders.  

## 2017-07-16 NOTE — ED Triage Notes (Addendum)
Patient reports she was assisting with moving a couch downstairs, missed a step, and fell. Furthermore, she fell on her right ankle and twisted the ankle. This accident occurred about 10:00 this morning. Patient has took [employer] Strength Goody Powder at 13:00 with mild relief. Around 15:00, the pain got worse.

## 2017-07-16 NOTE — Discharge Instructions (Signed)
Your x-ray was negative.  Please take 1000 mg of Tylenol +600 mg of ibuprofen every 8 hours for pain. You may take tramadol for breakthrough pain. Rest, ice, elevate. In the next 23 days, you should start doing light range of motion exercises like spelling the alphabet with the ankle to avoid joint stiffness and regain stretch of your joint. Follow-up with your primary care doctor for reevaluation if pain persists in 1-2 weeks.
